# Patient Record
Sex: Male | Born: 1985 | Race: White | Hispanic: No | Marital: Single | Smoking: Current every day smoker
Health system: Southern US, Community
[De-identification: ages and names within clinical notes are randomized; demographics above are authoritative.]

## PROBLEM LIST (undated history)

## (undated) HISTORY — PX: OTHER SURGICAL HISTORY: SHX169

---

## 2014-03-06 ENCOUNTER — Emergency Department (HOSPITAL_COMMUNITY)
Admission: EM | Admit: 2014-03-06 | Discharge: 2014-03-06 | Disposition: A | Discharge status: Home / Self Care | Payer: Self-pay | Attending: Emergency Medicine | Admitting: Emergency Medicine

## 2014-03-06 ENCOUNTER — Emergency Department (HOSPITAL_COMMUNITY): Payer: Self-pay

## 2014-03-06 ENCOUNTER — Encounter (HOSPITAL_COMMUNITY): Payer: Self-pay | Admitting: Emergency Medicine

## 2014-03-06 DIAGNOSIS — W19XXXA Unspecified fall, initial encounter: Secondary | ICD-10-CM

## 2014-03-06 DIAGNOSIS — Y998 Other external cause status: Secondary | ICD-10-CM | POA: Insufficient documentation

## 2014-03-06 DIAGNOSIS — Y9289 Other specified places as the place of occurrence of the external cause: Secondary | ICD-10-CM | POA: Insufficient documentation

## 2014-03-06 DIAGNOSIS — Y9389 Activity, other specified: Secondary | ICD-10-CM | POA: Insufficient documentation

## 2014-03-06 DIAGNOSIS — W01198A Fall on same level from slipping, tripping and stumbling with subsequent striking against other object, initial encounter: Secondary | ICD-10-CM | POA: Insufficient documentation

## 2014-03-06 DIAGNOSIS — S62002A Unspecified fracture of navicular [scaphoid] bone of left wrist, initial encounter for closed fracture: Secondary | ICD-10-CM

## 2014-03-06 DIAGNOSIS — S62015A Nondisplaced fracture of distal pole of navicular [scaphoid] bone of left wrist, initial encounter for closed fracture: Secondary | ICD-10-CM | POA: Insufficient documentation

## 2014-03-06 DIAGNOSIS — Z880 Allergy status to penicillin: Secondary | ICD-10-CM | POA: Insufficient documentation

## 2014-03-06 DIAGNOSIS — Z720 Tobacco use: Secondary | ICD-10-CM | POA: Insufficient documentation

## 2014-03-06 DIAGNOSIS — S92311S Displaced fracture of first metatarsal bone, right foot, sequela: Secondary | ICD-10-CM | POA: Insufficient documentation

## 2014-03-06 DIAGNOSIS — S199XXA Unspecified injury of neck, initial encounter: Secondary | ICD-10-CM | POA: Insufficient documentation

## 2014-03-06 MED ORDER — HYDROCODONE-ACETAMINOPHEN 5-325 MG PO TABS
2.0000 | ORAL_TABLET | Freq: Once | ORAL | Status: AC
  Administered 2014-03-06: 2 via ORAL
  Filled 2014-03-06: qty 2

## 2014-03-06 MED ORDER — HYDROCODONE-ACETAMINOPHEN 5-325 MG PO TABS: 1.0000 | ORAL_TABLET | Freq: Four times a day (QID) | ORAL | Status: DC | PRN

## 2014-03-06 NOTE — Discharge Instructions (Signed)
Scaphoid Fracture, Wrist A fracture is a break in the bone. The bone you have broken often does not show up as a fracture on x-ray until later on in the healing phase. This bone is called the scaphoid bone. With this bone, your caregiver will often cast or splint your wrist as though it is fractured, even if a fracture is not seen on the x-ray. This is often done with wrist injuries in which there is tenderness at the base of the thumb. An x-ray at 1-3 weeks after your injury may confirm this fracture. A cast or splint is used to protect and keep your injured bone in good position for healing. The cast or splint will be on generally for about 6 to 16 weeks, depending on your health, age, the fracture location and how quickly you heal. Another name for the scaphoid bone is the navicular bone. HOME CARE INSTRUCTIONS   To lessen the swelling and pain, keep the injured part elevated above your heart while sitting or lying down.  Apply ice to the injury for 15-20 minutes, 03-04 times per day while awake, for 2 days. Put the ice in a plastic bag and place a thin towel between the bag of ice and your cast.  If you have a plaster or fiberglass cast or splint:  Do not try to scratch the skin under the cast using sharp or pointed objects.  Check the skin around the cast every day. You may put lotion on any red or sore areas.  Keep your cast or splint dry and clean.  If you have a plaster splint:  Wear the splint as directed.  You may loosen the elastic bandage around the splint if your fingers become numb, tingle, or turn cold or blue.  If you have been put in a removable splint, wear and use as directed.  Do not put pressure on any part of your cast or splint; it may deform or break. Rest your cast or splint only on a pillow the first 24 hours until it is fully hardened.  Your cast or splint can be protected during bathing with a plastic bag. Do not lower the cast or splint into water.  Only take  over-the-counter or prescription medicines for pain, discomfort, or fever as directed by your caregiver.  If your caregiver has given you a follow up appointment, it is very important to keep that appointment. Not keeping the appointment could result in chronic pain and decreased function. If there is any problem keeping the appointment, you must call back to this facility for assistance. SEEK IMMEDIATE MEDICAL CARE IF:   Your cast gets damaged, wet or breaks.  You have continued severe pain or more swelling than you did before the cast or splint was put on.  Your skin or nails below the injury turn blue or gray, or feel cold or numb.  You have tingling or burning pain in your fingers or increasing pain with movement of your fingers Document Released: 12/27/2001 Document Revised: 03/31/2011 Document Reviewed: 08/25/2008 Exit[employer] Patient Information 2015 [employer]. This information is not intended to replace advice given to you by your health care provider. Make sure you discuss any questions you have with your health care provider.  

## 2014-03-06 NOTE — Progress Notes (Signed)
Orthopedic Tech Progress Note Patient Details:  John Weiss 04/09/1985 [id number]  Ortho Devices Type of Ortho Device: Thumb velcro splint Ortho Device/Splint Location: lue Ortho Device/Splint Interventions: Application   [name] 03/06/2014, 12:09 PM

## 2014-03-06 NOTE — ED Provider Notes (Signed)
CSN: [id number]     Arrival date & time 03/06/14  0905 History   First MD Initiated Contact with Patient 03/06/14 [phone]     Chief Complaint  Patient presents with  . Fall     (Consider location/radiation/quality/duration/timing/severity/associated sxs/prior Treatment) HPI Comments: Larey SeatFell off his deck last night. Was on the railing messing with the antenna and fell. Landed on the wooden deck, stopped his fall with his L hand.  Patient is a 29 y.o. male presenting with fall. The history is provided by the patient.  Fall This is a new problem. The current episode started yesterday. Episode frequency: once. The problem has not changed since onset.Pertinent negatives include no abdominal pain and no shortness of breath. Nothing aggravates the symptoms. Nothing relieves the symptoms. He has tried nothing for the symptoms. The treatment provided no relief.    History reviewed. No pertinent past medical history. Past Surgical History  Procedure Laterality Date  . Acl replacement     History reviewed. No pertinent family history. History  Substance Use Topics  . Smoking status: Current Every Day Smoker -- 0.50 packs/day    Types: Cigarettes  . Smokeless tobacco: Never Used  . Alcohol Use: No    Review of Systems  Constitutional: Negative for fever.  Respiratory: Negative for cough and shortness of breath.   Gastrointestinal: Negative for vomiting and abdominal pain.  All other systems reviewed and are negative.     Allergies  Penicillins  Home Medications   Prior to Admission medications   Medication Sig Start Date End Date Taking? Authorizing Provider  traMADol (ULTRAM) 50 MG tablet Take 50 mg by mouth every 6 (six) hours as needed.   Yes Historical Provider, MD   BP 160/76 mmHg  Pulse 113  Temp(Src) 98.2 F (36.8 C) (Oral)  Resp 18  SpO2 100% Physical Exam  Constitutional: He is oriented to person, place, and time. He appears well-developed and well-nourished. No  distress.  HENT:  Head: Normocephalic and atraumatic.  Mouth/Throat: Oropharynx is clear and moist. No oropharyngeal exudate.  Eyes: EOM are normal. Pupils are equal, round, and reactive to light.  Neck: Normal range of motion. Neck supple.  Cardiovascular: Normal rate and regular rhythm.  Exam reveals no friction rub.   No murmur heard. Pulmonary/Chest: Effort normal and breath sounds normal. No respiratory distress. He has no wheezes. He has no rales.  Abdominal: Soft. He exhibits no distension. There is no tenderness. There is no rebound.  Musculoskeletal: He exhibits no edema.       Right shoulder: He exhibits decreased range of motion and tenderness (diffuse).       Cervical back: He exhibits tenderness (with turning head).       Thoracic back: He exhibits no tenderness and no bony tenderness.       Lumbar back: He exhibits no tenderness and no bony tenderness.       Left hand: He exhibits decreased range of motion, tenderness and bony tenderness.       Hands:      Right foot: There is bony tenderness (1st metatarsal, distal phalanx).  Neurological: He is alert and oriented to person, place, and time.  Skin: No rash noted. He is not diaphoretic.  Nursing note and vitals reviewed.   ED Course  Procedures (including critical care time) Labs Review Labs Reviewed - No data to display  Imaging Review Dg Chest 2 View  03/06/2014   CLINICAL DATA:  Fall from balcony  EXAM: CHEST  2 VIEW  COMPARISON:  None.  FINDINGS: The heart size and mediastinal contours are within normal limits. Both lungs are clear. The visualized skeletal structures are unremarkable.  IMPRESSION: No active cardiopulmonary disease.   Electronically Signed   By: Judie Petit.  Shick M.D.   On: 03/06/2014 10:13   Dg Shoulder Right  03/06/2014   CLINICAL DATA:  Fall from balcony, right shoulder injury.  EXAM: RIGHT SHOULDER - 2+ VIEW  COMPARISON:  None.  FINDINGS: There is no evidence of fracture or dislocation. There is no  evidence of arthropathy or other focal bone abnormality. Soft tissues are unremarkable.  IMPRESSION: Negative.   Electronically Signed   By: Judie Petit.  Shick M.D.   On: 03/06/2014 10:15   Ct Cervical Spine Wo Contrast  03/06/2014   CLINICAL DATA:  Patient fell off balcony last night.  Fell 10 feet.  EXAM: CT CERVICAL SPINE WITHOUT CONTRAST  TECHNIQUE: Multidetector CT imaging of the cervical spine was performed without intravenous contrast. Multiplanar CT image reconstructions were also generated.  COMPARISON:  None.  FINDINGS: The alignment is anatomic. The vertebral body heights are maintained. There is no acute fracture. There is no static listhesis. The prevertebral soft tissues are normal. The intraspinal soft tissues are not fully imaged on this examination due to poor soft tissue contrast, but there is no gross soft tissue abnormality.  The disc spaces are maintained. Mild broad-based disc bulge at C3-4 and C5-6.  The visualized portions of the lung apices demonstrate no focal abnormality.  IMPRESSION: No acute osseous injury of the cervical spine.   Electronically Signed   By: Elige Ko   On: 03/06/2014 10:29   Dg Hand Complete Left  03/06/2014   CLINICAL DATA:  Jumped off the balcony last night. Left MCP joint pain.  EXAM: LEFT HAND - COMPLETE 3+ VIEW  COMPARISON:  None.  FINDINGS: There is no evidence of fracture or dislocation. Small, old ununited fracture of the tip of the left ulnar styloid process. There is no evidence of arthropathy or other focal bone abnormality. Soft tissues are unremarkable.  IMPRESSION: No acute osseous injury of the left hand.   Electronically Signed   By: Elige Ko   On: 03/06/2014 10:13   Dg Foot Complete Right  03/06/2014   CLINICAL DATA:  Pain following jump from balcony  EXAM: RIGHT FOOT COMPLETE - 3+ VIEW  COMPARISON:  August 28, 2014  FINDINGS: Frontal, oblique, and lateral views were obtained. There is again noted a fracture at the base of the first proximal  phalanx. This finding is unchanged compared to recent prior study. There is no new fracture. No dislocation. Hallux valgus deformity at the first IP joint is stable. There is no appreciable joint space narrowing. No erosive change.  IMPRESSION: No appreciable change in fracture of the proximal portion of the first proximal phalanx in essentially anatomic alignment. No new fracture. No dislocation. Hallux valgus deformity first IP joint.   Electronically Signed   By: Bretta Bang III M.D.   On: 03/06/2014 10:14     EKG Interpretation None      MDM   Final diagnoses:  Fall  Fracture of first metatarsal bone, right, sequela  Scaphoid fracture of wrist, left, closed, initial encounter    81M presents with fall from a balcony last night. He landed on his right side, bracing his fall with his left hand and right foot. No loss of consciousness, no head injury. Having neck pain this morning but not  immediately after the injury. Here vitals are stable. He has left thumb pain with full range of motion and is neurovascularly intact. He has a right distal toe pain and right first metatarsal pain with all sensation and motor function intact. He also has some right shoulder pain with decreased range of motion. He is neurovascular intact there also. He's also having some mild midline spine pain with turning his head to the side. Will image all these areas and give pain medicine. CT negative of c-spine. Xray with old foot fracture, will place in post-op shoe.  Hand xray ok, will place in velcro spica to cover for possible scaphoid fracture.  Elwin Mocha, MD 03/06/14 1054
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
## 2015-07-30 IMAGING — CR DG SHOULDER 2+V*R*
3 series · 3 of 3 positions shown · non-contrast
Comparison: None.

CLINICAL DATA: Fall from balcony, right shoulder injury.

EXAM:
RIGHT SHOULDER - 2+ VIEW

[shoulder grashey]
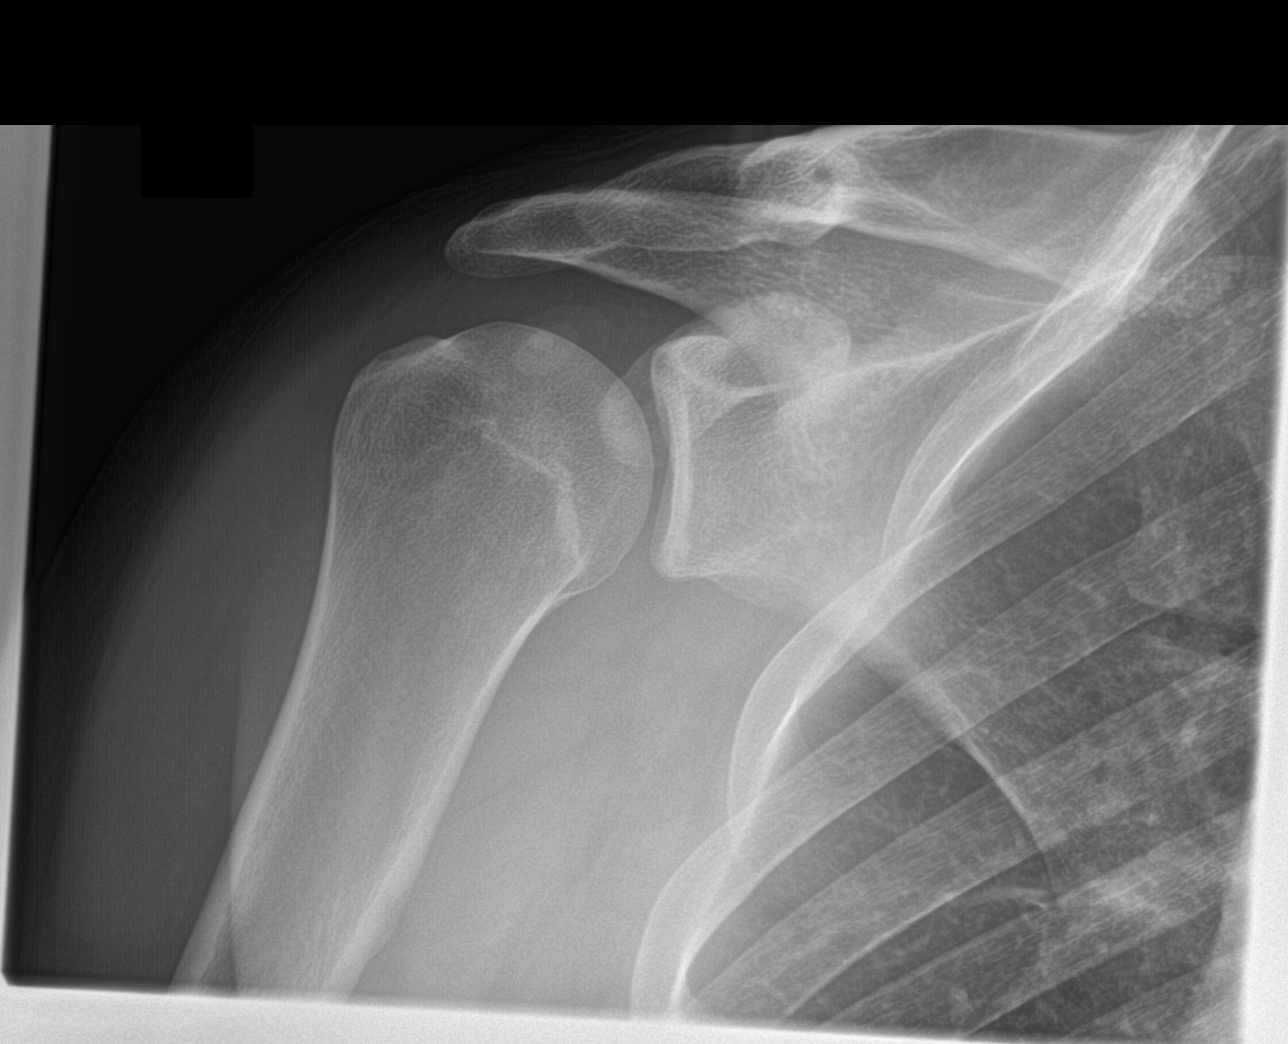

[shoulder y view]
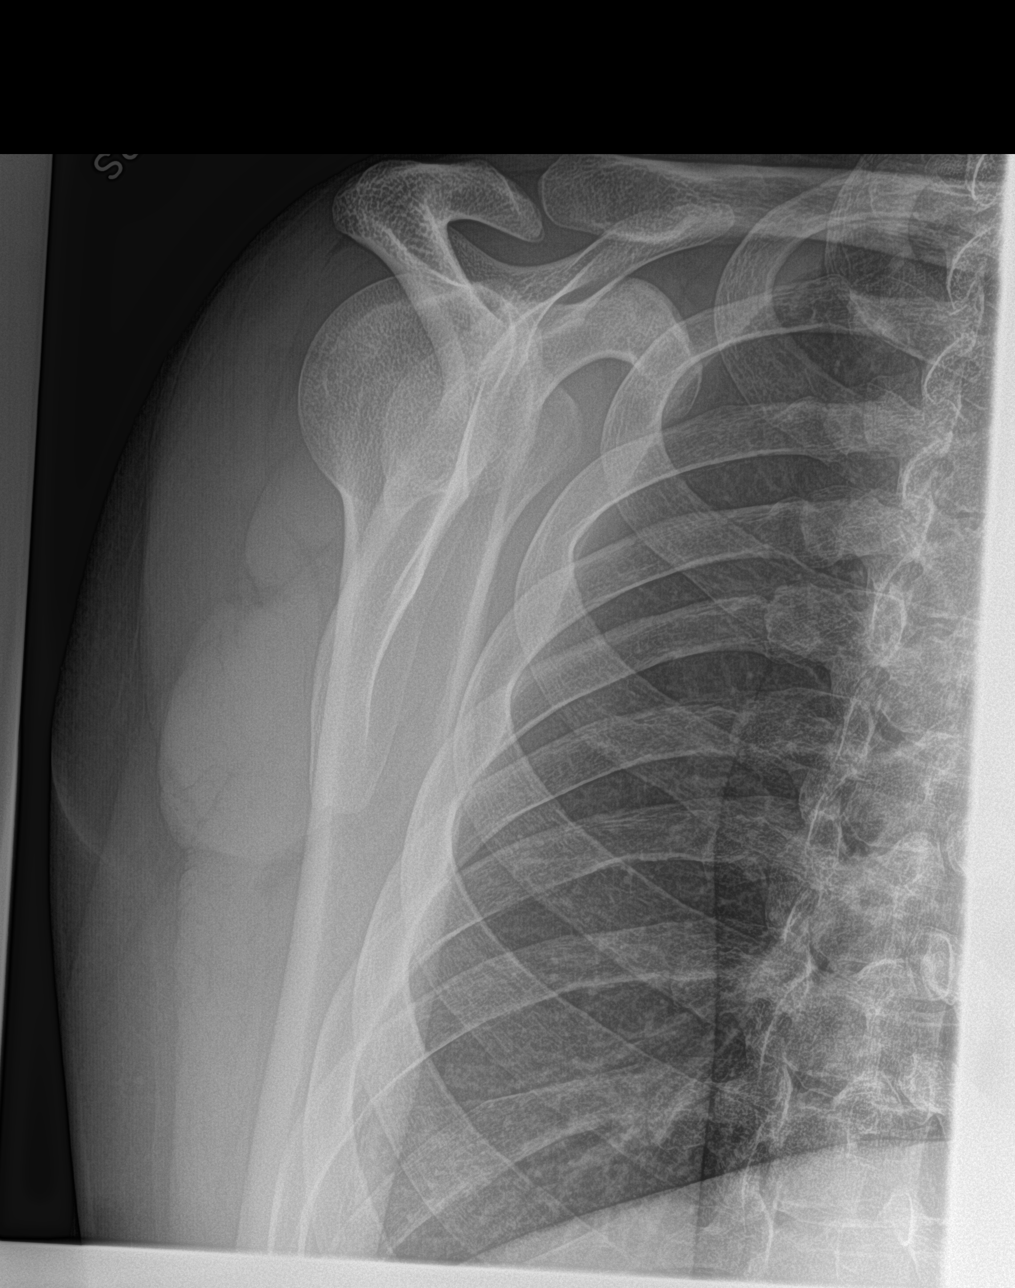

[shoulder axillary]
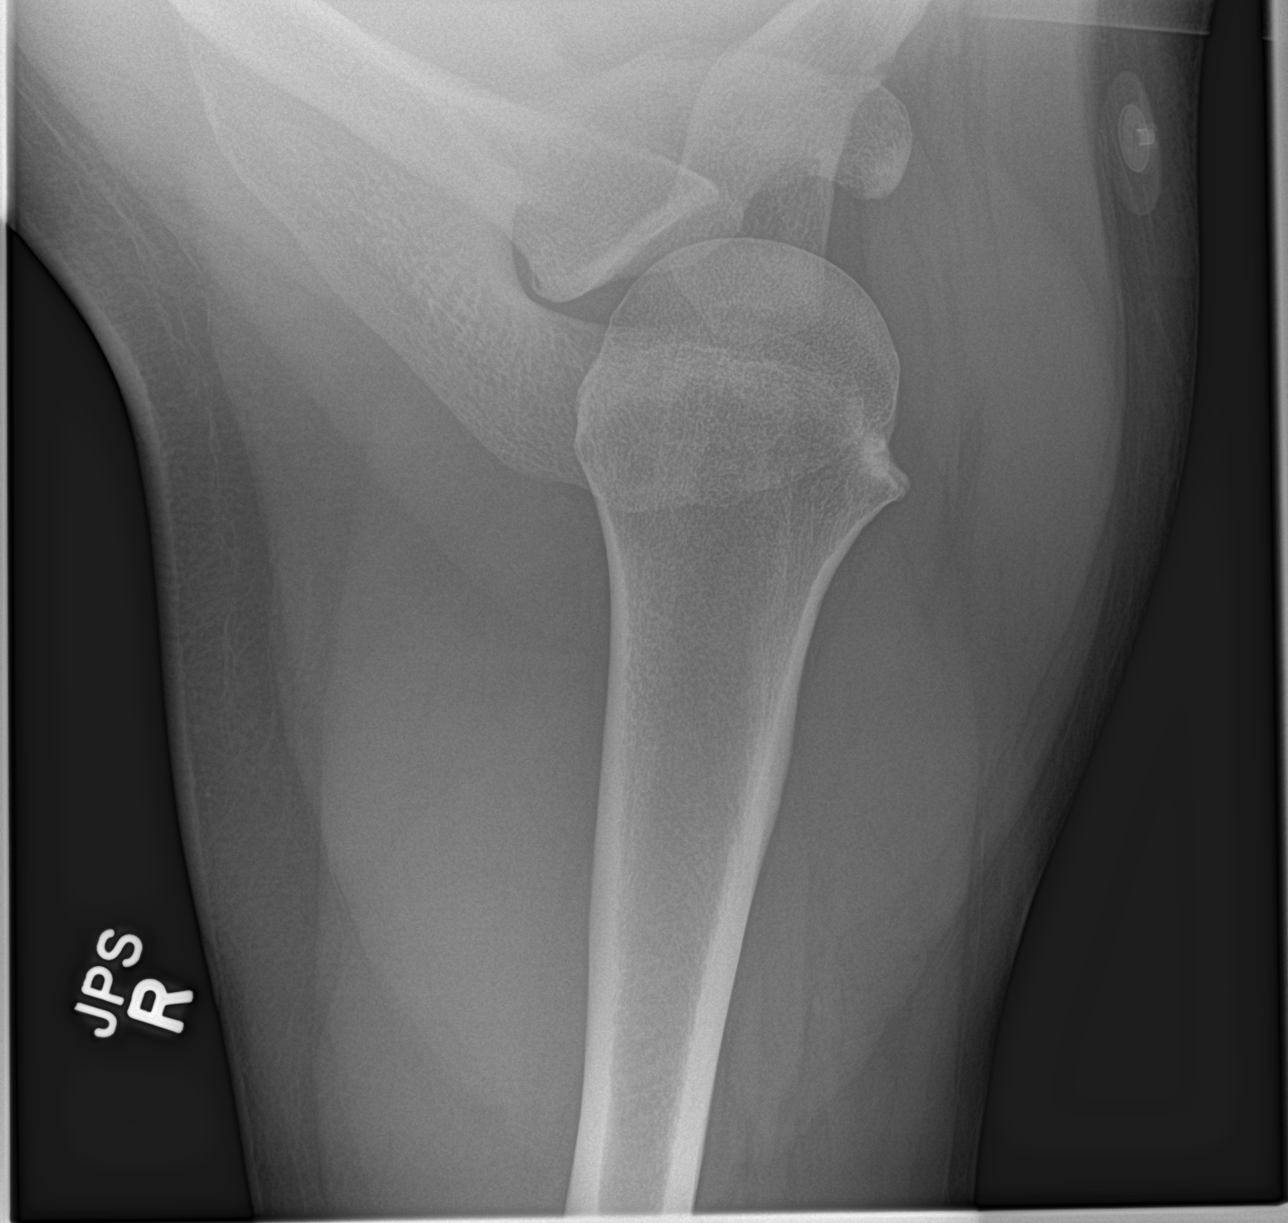

[3 of 3 positions shown; findings below may reference images not displayed]

FINDINGS: There is no evidence of fracture or dislocation. There is no
evidence of arthropathy or other focal bone abnormality. Soft
tissues are unremarkable.
IMPRESSION: Negative.

## 2015-07-30 IMAGING — CR DG HAND COMPLETE 3+V*L*
3 series · 3 of 3 positions shown · non-contrast
Comparison: None.

CLINICAL DATA: Jumped off the balcony last night. Left MCP joint
pain.

EXAM:
LEFT HAND - COMPLETE 3+ VIEW

[hand pa]
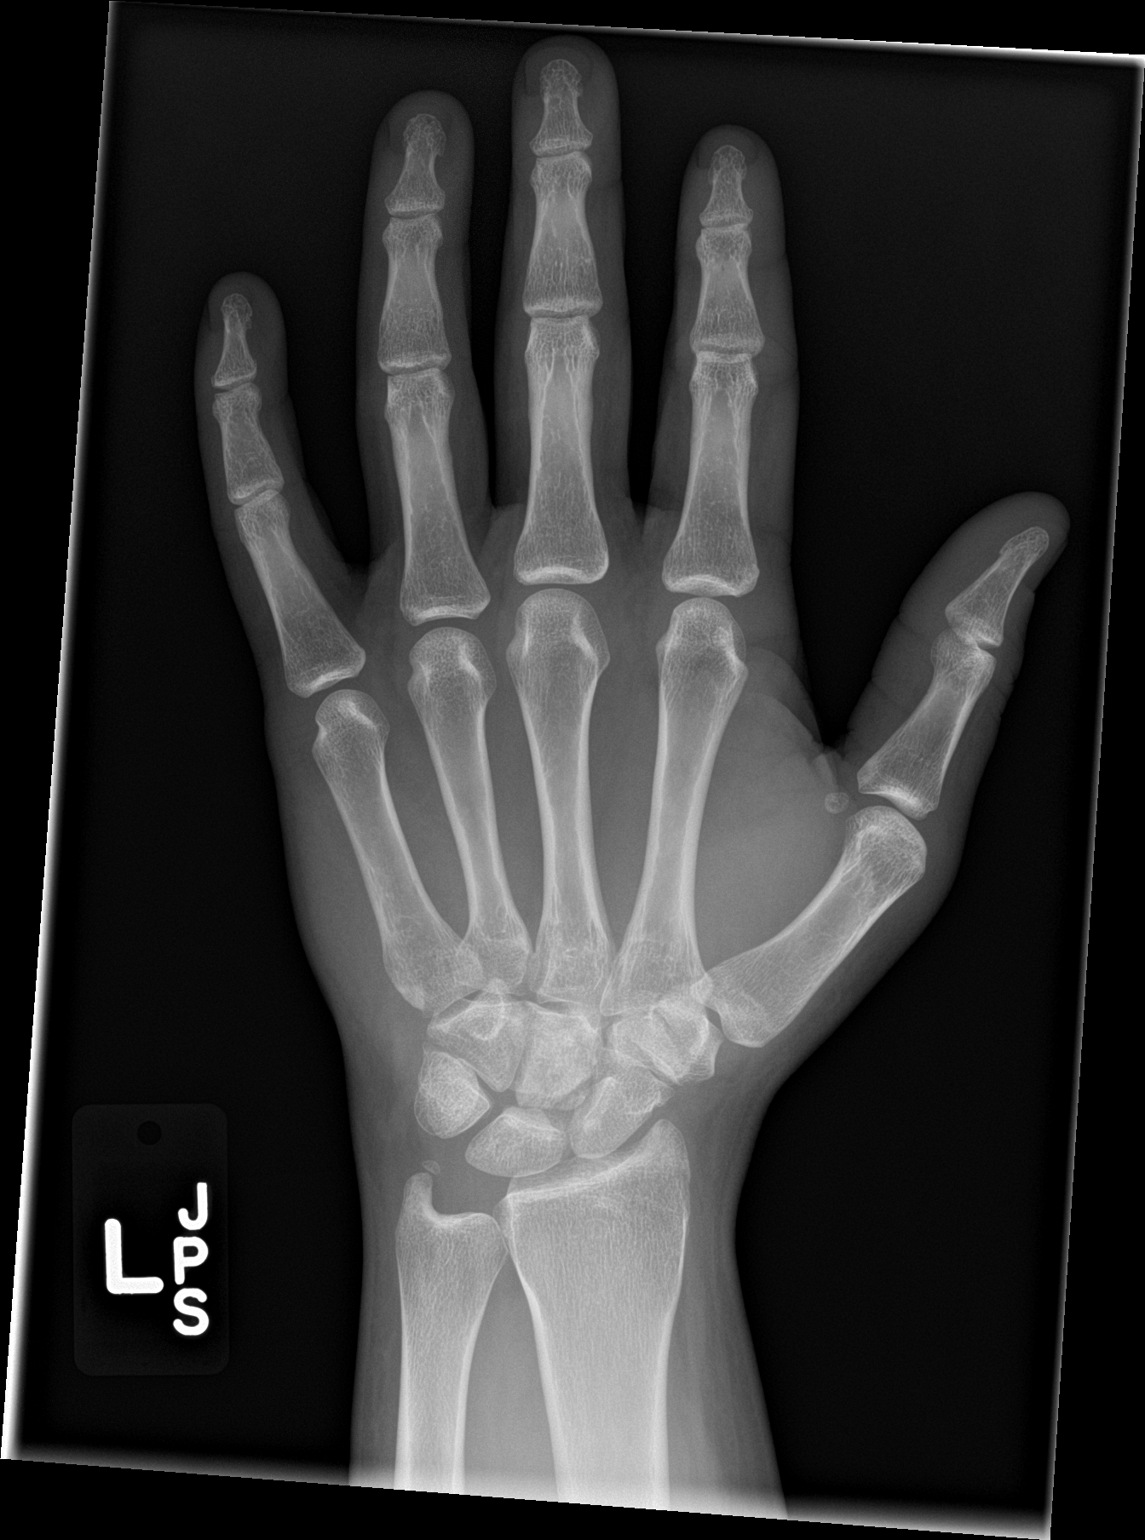

[hand obl]
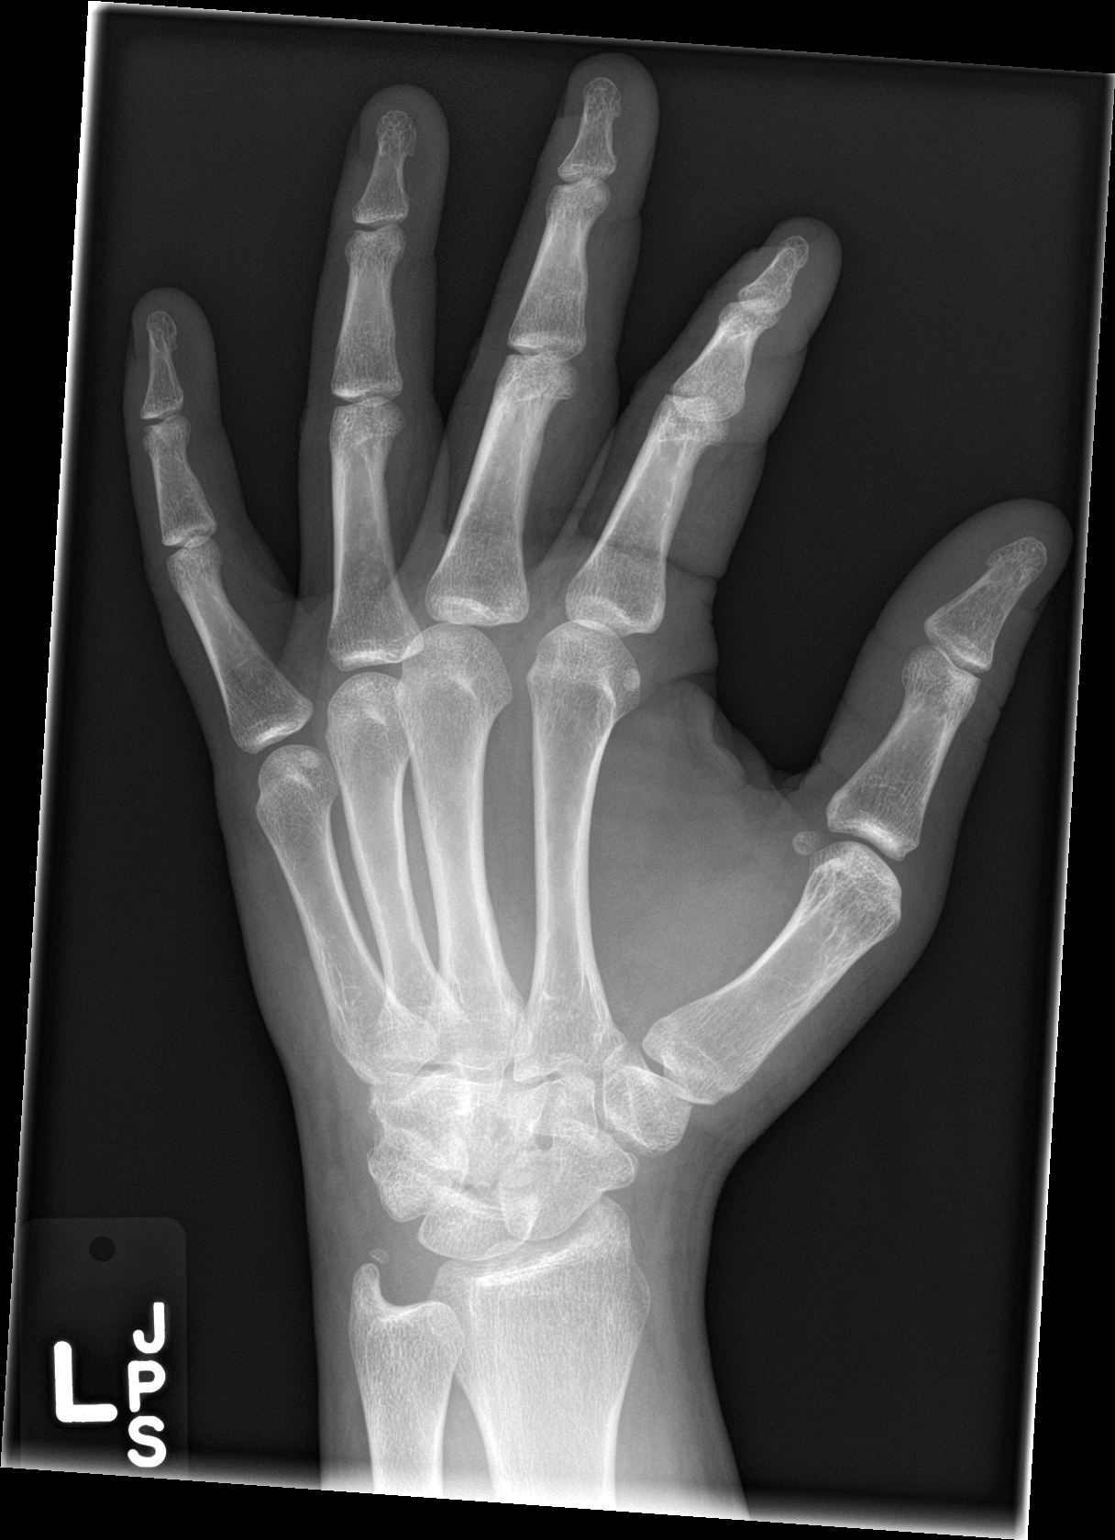

[hand lat]
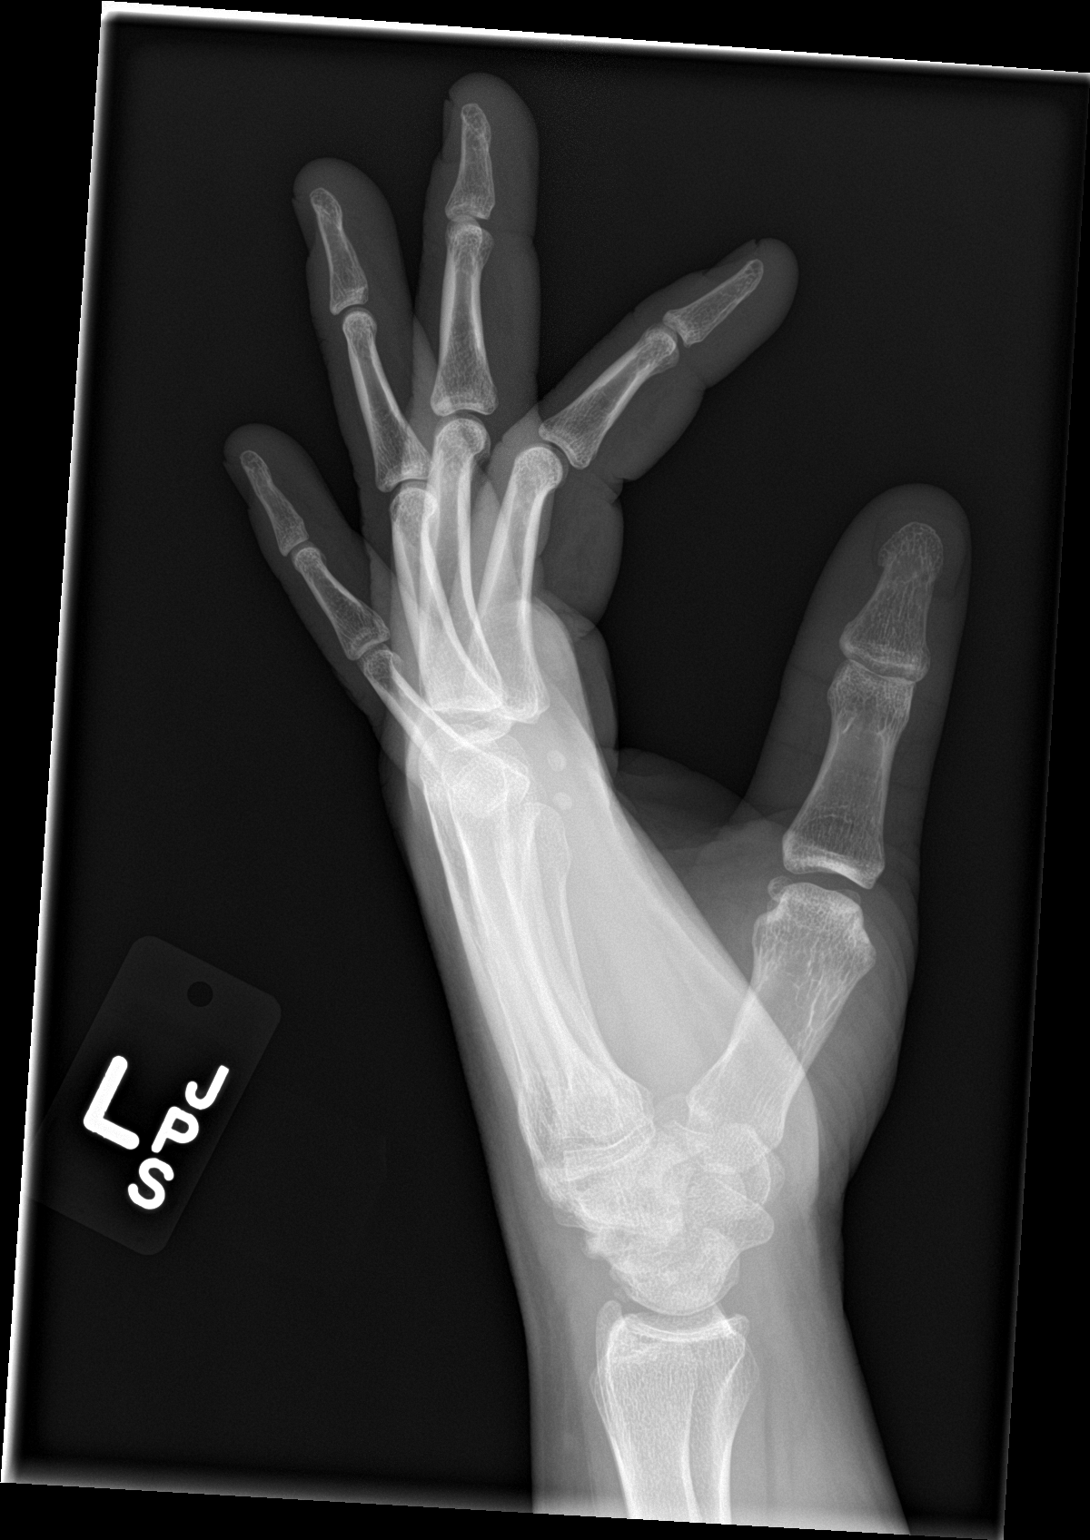

[3 of 3 positions shown; findings below may reference images not displayed]

FINDINGS: There is no evidence of fracture or dislocation. Small, old ununited
fracture of the tip of the left ulnar styloid process. There is no
evidence of arthropathy or other focal bone abnormality. Soft
tissues are unremarkable.
IMPRESSION: No acute osseous injury of the left hand.

## 2015-11-06 ENCOUNTER — Other Ambulatory Visit: Payer: Self-pay

## 2015-11-06 ENCOUNTER — Encounter (HOSPITAL_COMMUNITY): Payer: Self-pay | Admitting: *Deleted

## 2015-11-06 ENCOUNTER — Emergency Department (HOSPITAL_COMMUNITY)
Admission: EM | Admit: 2015-11-06 | Discharge: 2015-11-06 | Disposition: A | Discharge status: Home / Self Care | Payer: Self-pay | Attending: Emergency Medicine | Admitting: Emergency Medicine

## 2015-11-06 DIAGNOSIS — F17210 Nicotine dependence, cigarettes, uncomplicated: Secondary | ICD-10-CM | POA: Insufficient documentation

## 2015-11-06 DIAGNOSIS — R590 Localized enlarged lymph nodes: Secondary | ICD-10-CM | POA: Insufficient documentation

## 2015-11-06 DIAGNOSIS — L739 Follicular disorder, unspecified: Secondary | ICD-10-CM | POA: Insufficient documentation

## 2015-11-06 MED ORDER — SULFAMETHOXAZOLE-TRIMETHOPRIM 800-160 MG PO TABS: 1.0000 | ORAL_TABLET | Freq: Two times a day (BID) | ORAL | 0 refills | Status: AC

## 2015-11-06 NOTE — ED Notes (Signed)
BB size underneath left arm pit for a week. Denies pain unless moving or touching it.

## 2015-11-06 NOTE — ED Triage Notes (Addendum)
Pt has a sore spot in his left axilla.  It has been sore for about a week, it is not red or visible to me but a hard area can be palpated and is deep.  Pt is a bit anxious and states that he has had 4-5 episodes of his heart "fluttering", normally he has about one episode a month and he has from years, EKG done.  No CP or sob. Pt declines further eval for this

## 2015-11-06 NOTE — ED Notes (Signed)
Pt states that since he has had the "heart fluttering" for several years he does not want this addressed today as he wants to wait until his insurance kicks in.  He would only like the "knot in my left armpit" looked at

## 2015-11-06 NOTE — ED Provider Notes (Signed)
[facility]-EMERGENCY DEPT Provider Note   CSN: [id number] Arrival date & time: 11/06/15  1701  By signing my name below, I, John Weiss, attest that this documentation has been prepared under the direction and in the presence of [facility], [place].  Electronically Signed: Rosario Weiss, ED Scribe. 11/06/15. 5:59 PM.  History   Chief Complaint Chief Complaint  Patient presents with  . Lymphadenopathy   The history is provided by the patient. No language interpreter was used.  Illness  This is a new problem. The current episode started more than 1 week ago. The problem occurs constantly. The problem has been gradually worsening. Pertinent negatives include no chest pain, no abdominal pain, no headaches and no shortness of breath. Nothing aggravates the symptoms. Nothing relieves the symptoms. He has tried nothing for the symptoms. The treatment provided no relief.   HPI Comments: John Weiss is a 30 y.o. male with no pertinent PMHx, who presents to the Emergency Department complaining of gradual onset, painful area of swelling to the left axilla onset ~1 day ago, gradually worsening over the past ~2 days. Pt reports that his pain is moderate to the area and the area has grown to three finger breadths in length since onset. No wound involvement to the area. No h/o similar symptoms. His pain to the area is exacerbated with left arm movements. Pt initially noted that he has intermittent sensations of heart fluttering over the past several years to triage nurse, however, he denies chest pain, sensation of palpitations, SOB, nausea, vomiting, or any other associated symptoms at this time. He states that he does not want to have a workup for this today.   PCP: none  History reviewed. No pertinent past medical history.  There are no active problems to display for this patient.  Past Surgical History:  Procedure Laterality Date  . ACL replacement      Home Medications    Prior to  Admission medications   Medication Sig Start Date End Date Taking? Authorizing Provider  HYDROcodone-acetaminophen (NORCO/VICODIN) 5-325 MG per tablet Take 1 tablet by mouth every 6 (six) hours as needed for moderate pain. 03/06/14   Elwin Mocha, MD  sulfamethoxazole-trimethoprim (BACTRIM DS,SEPTRA DS) 800-160 MG tablet Take 1 tablet by mouth 2 (two) times daily. 11/06/15 11/13/15  Hope Orlene Och, NP  traMADol (ULTRAM) 50 MG tablet Take 50 mg by mouth every 6 (six) hours as needed.    Historical Provider, MD   Family History No family history on file.  Social History Social History  Substance Use Topics  . Smoking status: Current Every Day Smoker    Packs/day: 0.50    Types: Cigarettes  . Smokeless tobacco: Never Used  . Alcohol use No   Allergies   Penicillins  Review of Systems Review of Systems  Respiratory: Negative for shortness of breath.   Cardiovascular: Negative for chest pain and palpitations.  Gastrointestinal: Negative for abdominal pain, nausea and vomiting.  Musculoskeletal: Positive for myalgias.  Neurological: Negative for headaches.  Hematological: Positive for adenopathy.  All other systems reviewed and are negative.   Physical Exam Updated Vital Signs BP 142/77 (BP Location: Right Arm)   Pulse 84   Temp 97.9 F (36.6 C) (Oral)   Resp 18   Ht 5\' 8"  (1.727 m)   Wt 85.3 kg   SpO2 98%   BMI 28.59 kg/m   Physical Exam  Constitutional: He is oriented to person, place, and time. He appears well-developed and well-nourished.  HENT:  Head: Normocephalic.  Eyes: Conjunctivae are normal.  Neck: Normal range of motion. Neck supple.  Cardiovascular: Normal rate, regular rhythm and normal heart sounds.   No murmur heard. Pulmonary/Chest: Effort normal and breath sounds normal. No respiratory distress. He has no wheezes. He has no rales.  Abdominal: Soft. He exhibits no distension. There is no tenderness.  Multiple areas of folliculitis of the abdomen.      Musculoskeletal: Normal range of motion. He exhibits tenderness.  Neurological: He is alert and oriented to person, place, and time.  Skin: Skin is warm and dry.  Tender raised bee bee sized node to the left axilla.   Psychiatric: He has a normal mood and affect. His behavior is normal.  Nursing note and vitals reviewed.  ED Treatments / Results  DIAGNOSTIC STUDIES: Oxygen Saturation is 98% on RA, normal by my interpretation.   COORDINATION OF CARE: 5:57 PM-Discussed next steps with pt. Pt verbalized understanding and is agreeable with the plan.   Labs (all labs ordered are listed, but only abnormal results are displayed) Labs Reviewed - No data to display  Procedures Procedures   Medications Ordered in ED Medications - No data to display  Initial Impression / Assessment and Plan / ED Course  I have reviewed the triage vital signs and the nursing notes.  Clinical Course   Pt who presents with a 1 week history of mild swelling to the left axilla. Area is mildly tender to palpation. Pt with no other symptoms at this time, and exam is additionally otherwise non-concerning. Advised to use warm compresses to the area and rx Bactrim. [facility] for [facility], especially if symptoms suddenly worsen or are accompanied by a new onset of nausea, vomiting, fever, chills, chest pain, shortness of breath, or any other concerning symptoms. Triage nurse initially noted that pt had been experiencing intermittent episodes of heart fluttering over the past several years; however, the pt states that this has been an ongoing problem. He stated at bedside that this problem has not acutely changed recently and declined a cardiac workup at this time. Pt is comfortable with above plan and is stable for discharge at this time. All questions were answered prior to disposition. Will treat for folliculitis and possible early skin infection of axilla.  Final Clinical  Impressions(s) / ED Diagnoses   Final diagnoses:  Axillary adenopathy  Folliculitis   New Prescriptions Discharge Medication List as of 11/06/2015  6:06 PM    START taking these medications   Details  sulfamethoxazole-trimethoprim (BACTRIM DS,SEPTRA DS) 800-160 MG tablet Take 1 tablet by mouth 2 (two) times daily., Starting Tue 11/06/2015, Until Tue 11/13/2015, Print       I personally performed the services described in this documentation, which was scribed in my presence. The recorded information has been reviewed and is accurate.     [address]Hope Bluet[place]M Ne[place], [place] 11/07/15 [id number]    Linwood DibblesJon Knapp, MD 11/08/15 [phone]

## 2017-02-23 ENCOUNTER — Encounter (HOSPITAL_COMMUNITY): Payer: Self-pay

## 2017-02-23 DIAGNOSIS — R51 Headache: Secondary | ICD-10-CM | POA: Insufficient documentation

## 2017-02-23 DIAGNOSIS — Z5321 Procedure and treatment not carried out due to patient leaving prior to being seen by health care provider: Secondary | ICD-10-CM | POA: Insufficient documentation
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
## 2017-02-24 ENCOUNTER — Emergency Department (HOSPITAL_COMMUNITY)
Admission: EM | Admit: 2017-02-24 | Discharge: 2017-02-24 | Disposition: A | Discharge status: Home / Self Care | Payer: Self-pay | Attending: Emergency Medicine | Admitting: Emergency Medicine

## 2017-02-24 NOTE — ED Notes (Signed)
Pt left after being advised of the wait time.  

## 2017-03-02 ENCOUNTER — Other Ambulatory Visit: Payer: Self-pay

## 2017-03-02 ENCOUNTER — Emergency Department (HOSPITAL_COMMUNITY)
Admission: EM | Admit: 2017-03-02 | Discharge: 2017-03-02 | Disposition: A | Discharge status: Home / Self Care | Payer: Self-pay | Attending: Emergency Medicine | Admitting: Emergency Medicine

## 2017-03-02 ENCOUNTER — Encounter (HOSPITAL_COMMUNITY): Payer: Self-pay

## 2017-03-02 DIAGNOSIS — J029 Acute pharyngitis, unspecified: Secondary | ICD-10-CM | POA: Insufficient documentation

## 2017-03-02 DIAGNOSIS — Z79899 Other long term (current) drug therapy: Secondary | ICD-10-CM | POA: Insufficient documentation

## 2017-03-02 DIAGNOSIS — F17210 Nicotine dependence, cigarettes, uncomplicated: Secondary | ICD-10-CM | POA: Insufficient documentation

## 2017-03-02 LAB — RAPID STREP SCREEN (MED CTR MEBANE ONLY): Streptococcus, Group A Screen (Direct): NEGATIVE

## 2017-03-02 MED ORDER — DEXAMETHASONE 1 MG/ML PO CONC
10.0000 mg | Freq: Once | ORAL | Status: DC
  Filled 2017-03-02: qty 10

## 2017-03-02 MED ORDER — HYDROCODONE-ACETAMINOPHEN 5-325 MG PO TABS: 1.0000 | ORAL_TABLET | ORAL | 0 refills | Status: AC | PRN

## 2017-03-02 MED ORDER — DEXAMETHASONE SODIUM PHOSPHATE 10 MG/ML IJ SOLN
INTRAMUSCULAR | Status: AC
  Administered 2017-03-02: 10 mg via ORAL
  Filled 2017-03-02: qty 1

## 2017-03-02 MED ORDER — HYDROCODONE-ACETAMINOPHEN 5-325 MG PO TABS
1.0000 | ORAL_TABLET | Freq: Once | ORAL | Status: AC
  Administered 2017-03-02: 1 via ORAL
  Filled 2017-03-02: qty 1

## 2017-03-02 MED ORDER — CLINDAMYCIN HCL 300 MG PO CAPS: 300.0000 mg | ORAL_CAPSULE | Freq: Three times a day (TID) | ORAL | 0 refills | Status: AC

## 2017-03-02 NOTE — Discharge Instructions (Signed)
Please read attached information. If you experience any new or worsening signs or symptoms please return to the emergency room for evaluation. Please follow-up with your primary care provider or specialist as discussed. Please use medication prescribed only as directed and discontinue taking if you have any concerning signs or symptoms.   °

## 2017-03-02 NOTE — ED Provider Notes (Signed)
[facility]   CSN: [id number] Arrival date & time: 03/02/17  1146   History   Chief Complaint No chief complaint on file.   HPI Maryella Shiversimothy Artman is a 32 y.o. male.  HPI   32 year old male presents today with complaints of sore throat.  Patient reports a one-week history of ear pressure pain, sore throat.  Patient notes symptoms started to improve improve over the last several days but woke up this morning and had some blood in his secretions.  Patient reports painful swallowing, denies any shortness of breath or difficulty swallowing.  He notes taking numerous over-the-counter medications including ibuprofen, Benadryl, cough and cold medication.  Patient reports originally having a fever, none recently.  No medications prior to arrival.  History reviewed. No pertinent past medical history.  There are no active problems to display for this patient.   Past Surgical History:  Procedure Laterality Date  . ACL replacement       Home Medications    Prior to Admission medications   Medication Sig Start Date End Date Taking? Authorizing Provider  clindamycin (CLEOCIN) 300 MG capsule Take 1 capsule (300 mg total) by mouth 3 (three) times daily. 03/02/17   Dinia Joynt, Tinnie GensJeffrey, PA-C  HYDROcodone-acetaminophen (NORCO/VICODIN) 5-325 MG tablet Take 1 tablet by mouth every 4 (four) hours as needed. 03/02/17   Lesa Vandall, Tinnie GensJeffrey, PA-C  traMADol (ULTRAM) 50 MG tablet Take 50 mg by mouth every 6 (six) hours as needed.    [provider]    Family History No family history on file.  Social History Social History   Tobacco Use  . Smoking status: Current Every Day Smoker    Packs/day: 0.50    Types: Cigarettes  . Smokeless tobacco: Never Used  Substance Use Topics  . Alcohol use: No  . Drug use: No     Allergies   Penicillins   Review of Systems Review of Systems  All other systems reviewed and are negative.  Physical  Exam Updated Vital Signs BP 134/85 (BP Location: Right Arm)   Pulse 91   Temp 98.4 F (36.9 C) (Oral)   Resp 18   SpO2 99%   Physical Exam  Constitutional: He is oriented to person, place, and time. He appears well-developed and well-nourished.  HENT:  Head: Normocephalic and atraumatic.  Right Ear: Tympanic membrane and external ear normal.  Left Ear: Tympanic membrane and external ear normal.  Bilateral tonsillar swelling with minor erythema, uvula midline rises with phonation, tonsils symmetrical no signs of peritonsillar abscess, no signs of retropharyngeal abscess no pooling of secretions, no bleeding or purulent discharge  Eyes: Conjunctivae are normal. Pupils are equal, round, and reactive to light. Right eye exhibits no discharge. Left eye exhibits no discharge. No scleral icterus.  Neck: Normal range of motion. No JVD present. No tracheal deviation present.  Pulmonary/Chest: Effort normal and breath sounds normal. No stridor. No respiratory distress. He has no wheezes. He has no rales.  Neurological: He is alert and oriented to person, place, and time. Coordination normal.  Psychiatric: He has a normal mood and affect. His behavior is normal. Judgment and thought content normal.  Nursing note and vitals reviewed.  ED Treatments / Results  Labs (all labs ordered are listed, but only abnormal results are displayed) Labs Reviewed  RAPID STREP SCREEN (NOT AT [facility])  CULTURE, GROUP A STREP [facility]    EKG  EKG Interpretation None       Radiology No results  found.  Procedures Procedures (including critical care time)  Medications Ordered in ED Medications  dexamethasone (DECADRON) 1 MG/ML solution 10 mg (0 mg Oral Hold 03/02/17 1259)  HYDROcodone-acetaminophen (NORCO/VICODIN) 5-325 MG per tablet 1 tablet (1 tablet Oral Given 03/02/17 1259)  dexamethasone (DECADRON) 10 MG/ML injection (10 mg Oral Given 03/02/17 1259)     Initial Impression / Assessment and Plan / ED  Course  I have reviewed the triage vital signs and the nursing notes.  Pertinent labs & imaging results that were available during my care of the patient were reviewed by me and considered in my medical decision making (see chart for details).     Final Clinical Impressions(s) / ED Diagnoses   Final diagnoses:  Pharyngitis, unspecified etiology    Labs:   Imaging:  Consults:  Therapeutics: Decadron, Norco  Discharge Meds: Clindamycin, hydrocodone  Assessment/Plan: 32 year old male presents today with sore throat.  Patient has bilateral tonsillar swelling, no unilateral swelling, no posterior oropharynx swelling no signs of PTA or RPA.  Patient with 7 days of symptoms, he will be treated for bacterial pharyngitis given his presentation.  His rapid strep is negative culture pending.  Patient given a dose of Decadron, pain medication he will be reassessed prior to disposition.  Patient with improvement here in the ED, continued with no difficulty swallowing or breathing.  Patient does have consistent findings that he will be started on antibiotics, he is given strict return precautions, he verbalized understanding and agreement to today's plan.    ED Discharge Orders        Ordered    clindamycin (CLEOCIN) 300 MG capsule  3 times daily     03/02/17 1446    HYDROcodone-acetaminophen (NORCO/VICODIN) 5-325 MG tablet  Every 4 hours PRN     03/02/17 1447       Eyvonne Mechanic, PA-C 03/02/17 1453    Azalia Bilis, MD 03/02/17 1655

## 2017-03-02 NOTE — ED Triage Notes (Addendum)
Patient complains of 1 week of sore throat with secretions and sputum from same. States that he has ben trying otc meds with no relief. Swollen tonsils noted with bloody secretions

## 2017-03-02 NOTE — ED Notes (Signed)
Pt in room

## 2017-03-04 LAB — CULTURE, GROUP A STREP (THRC)

## 2017-03-05 ENCOUNTER — Telehealth: Payer: Self-pay | Admitting: Emergency Medicine

## 2017-03-05 NOTE — Telephone Encounter (Signed)
Post ED Visit - Positive Culture Follow-up  Culture report reviewed by antimicrobial stewardship pharmacist:  [x]  John Weiss, Pharm.D. []  John Weiss, Pharm.D., BCPS AQ-ID []  John Weiss, Pharm.D., BCPS []  John Weiss, Pharm.D., BCPS []  John Weiss, [address]Ph[address]D., BCPS, AAHIVP []  John Weiss, Pharm.D., BCPS, AAHIVP []  John Weiss, PharmD, BCPS []  John Weiss, PharmD []  John Weiss, PharmD, BCPS  Positive strep culture Treated with clindamycin, organism sensitive to the same and no further patient follow-up is required at this time.  John Weiss, John Weiss 03/05/2017, 1:15 PM
